# Patient Record
Sex: Female | Born: 1970 | Race: Black or African American | Hispanic: No | Marital: Married | Smoking: Never smoker
Health system: Southern US, Community
[De-identification: ages and names within clinical notes are randomized; demographics above are authoritative.]

## PROBLEM LIST (undated history)

## (undated) DIAGNOSIS — D649 Anemia, unspecified: Secondary | ICD-10-CM

## (undated) DIAGNOSIS — Z5189 Encounter for other specified aftercare: Secondary | ICD-10-CM

## (undated) DIAGNOSIS — R0602 Shortness of breath: Secondary | ICD-10-CM

## (undated) DIAGNOSIS — J40 Bronchitis, not specified as acute or chronic: Secondary | ICD-10-CM

## (undated) DIAGNOSIS — R1900 Intra-abdominal and pelvic swelling, mass and lump, unspecified site: Secondary | ICD-10-CM

## (undated) DIAGNOSIS — Z973 Presence of spectacles and contact lenses: Secondary | ICD-10-CM

## (undated) HISTORY — PX: TUBAL LIGATION: SHX77

---

## 1999-07-09 ENCOUNTER — Other Ambulatory Visit: Admission: RE | Admit: 1999-07-09 | Discharge: 1999-07-09 | Payer: Self-pay | Admitting: Obstetrics & Gynecology

## 1999-07-26 ENCOUNTER — Ambulatory Visit (HOSPITAL_COMMUNITY): Admission: RE | Admit: 1999-07-26 | Discharge: 1999-07-26 | Payer: Self-pay | Admitting: Obstetrics & Gynecology

## 2003-09-23 ENCOUNTER — Emergency Department (HOSPITAL_COMMUNITY): Admission: EM | Admit: 2003-09-23 | Discharge: 2003-09-24 | Payer: Self-pay | Admitting: Emergency Medicine

## 2010-07-21 ENCOUNTER — Ambulatory Visit (INDEPENDENT_AMBULATORY_CARE_PROVIDER_SITE_OTHER): Payer: Self-pay

## 2010-07-21 ENCOUNTER — Inpatient Hospital Stay (INDEPENDENT_AMBULATORY_CARE_PROVIDER_SITE_OTHER)
Admission: RE | Admit: 2010-07-21 | Discharge: 2010-07-21 | Disposition: A | Discharge status: Home / Self Care | Payer: Self-pay | Source: Ambulatory Visit | Attending: Family Medicine | Admitting: Family Medicine

## 2010-07-21 DIAGNOSIS — S139XXA Sprain of joints and ligaments of unspecified parts of neck, initial encounter: Secondary | ICD-10-CM

## 2010-07-21 DIAGNOSIS — S335XXA Sprain of ligaments of lumbar spine, initial encounter: Secondary | ICD-10-CM

## 2011-04-09 DIAGNOSIS — Z862 Personal history of diseases of the blood and blood-forming organs and certain disorders involving the immune mechanism: Secondary | ICD-10-CM

## 2011-04-09 HISTORY — DX: Personal history of diseases of the blood and blood-forming organs and certain disorders involving the immune mechanism: Z86.2

## 2011-05-01 ENCOUNTER — Emergency Department (HOSPITAL_COMMUNITY)
Admission: EM | Admit: 2011-05-01 | Discharge: 2011-05-01 | Disposition: A | Discharge status: Home / Self Care | Payer: 59 | Attending: Emergency Medicine | Admitting: Emergency Medicine

## 2011-05-01 ENCOUNTER — Encounter (HOSPITAL_COMMUNITY): Payer: Self-pay | Admitting: Emergency Medicine

## 2011-05-01 ENCOUNTER — Emergency Department (HOSPITAL_COMMUNITY): Payer: 59

## 2011-05-01 DIAGNOSIS — IMO0001 Reserved for inherently not codable concepts without codable children: Secondary | ICD-10-CM | POA: Insufficient documentation

## 2011-05-01 DIAGNOSIS — R0602 Shortness of breath: Secondary | ICD-10-CM | POA: Insufficient documentation

## 2011-05-01 DIAGNOSIS — R197 Diarrhea, unspecified: Secondary | ICD-10-CM | POA: Insufficient documentation

## 2011-05-01 DIAGNOSIS — R0789 Other chest pain: Secondary | ICD-10-CM | POA: Insufficient documentation

## 2011-05-01 DIAGNOSIS — R1110 Vomiting, unspecified: Secondary | ICD-10-CM | POA: Insufficient documentation

## 2011-05-01 DIAGNOSIS — E860 Dehydration: Secondary | ICD-10-CM | POA: Insufficient documentation

## 2011-05-01 MED ORDER — LOPERAMIDE HCL 2 MG PO CAPS: 2.0000 mg | ORAL_CAPSULE | Freq: Four times a day (QID) | ORAL | Status: AC | PRN

## 2011-05-01 MED ORDER — ALBUTEROL SULFATE (5 MG/ML) 0.5% IN NEBU
5.0000 mg | INHALATION_SOLUTION | Freq: Once | RESPIRATORY_TRACT | Status: AC
  Administered 2011-05-01: 5 mg via RESPIRATORY_TRACT
  Filled 2011-05-01: qty 1

## 2011-05-01 MED ORDER — ALBUTEROL SULFATE HFA 108 (90 BASE) MCG/ACT IN AERS: INHALATION_SPRAY | RESPIRATORY_TRACT | Status: DC

## 2011-05-01 MED ORDER — ONDANSETRON 4 MG PO TBDP: 4.0000 mg | ORAL_TABLET | Freq: Three times a day (TID) | ORAL | Status: AC | PRN

## 2011-05-01 MED ORDER — IPRATROPIUM BROMIDE 0.02 % IN SOLN
0.5000 mg | RESPIRATORY_TRACT | Status: AC
  Administered 2011-05-01: 0.5 mg via RESPIRATORY_TRACT
  Filled 2011-05-01: qty 2.5

## 2011-05-01 MED ORDER — SODIUM CHLORIDE 0.9 % IV BOLUS (SEPSIS)
1000.0000 mL | Freq: Once | INTRAVENOUS | Status: AC
  Administered 2011-05-01: 1000 mL via INTRAVENOUS

## 2011-05-01 MED ORDER — HYDROCODONE-HOMATROPINE 5-1.5 MG/5ML PO SYRP: 5.0000 mL | ORAL_SOLUTION | Freq: Four times a day (QID) | ORAL | Status: AC | PRN

## 2011-05-01 MED ORDER — SODIUM CHLORIDE 0.9 % IV BOLUS (SEPSIS)
500.0000 mL | Freq: Once | INTRAVENOUS | Status: AC
  Administered 2011-05-01: 500 mL via INTRAVENOUS

## 2011-05-01 MED ORDER — ONDANSETRON HCL 4 MG/2ML IJ SOLN
4.0000 mg | Freq: Once | INTRAMUSCULAR | Status: AC
  Administered 2011-05-01: 4 mg via INTRAVENOUS
  Filled 2011-05-01: qty 2

## 2011-05-01 NOTE — ED Notes (Signed)
PT. REPORTS VOMITTING , DIARRHEA , CHILLS , RUNNY NOSE WITH SOB AND PRODUCTIVE COUGH/CHEST CONGESTION ONSET LAST Thursday .

## 2011-05-01 NOTE — ED Notes (Signed)
Pt reports N/V/D since Thursday.  Denies pain.  Alerts and oriented X4. Pt resting and feeling a little better.

## 2011-05-01 NOTE — ED Notes (Signed)
Pt given ice chips

## 2011-05-01 NOTE — ED Provider Notes (Signed)
History     CSN: [id number]  Arrival date & time 05/01/11  0540   First MD Initiated Contact with Patient 05/01/11 [phone]      Chief Complaint  Patient presents with  . Emesis    (Consider location/radiation/quality/duration/timing/severity/associated sxs/prior treatment) Patient is a 41 y.o. female presenting with shortness of breath. The history is provided by the patient.  Shortness of Breath  The current episode started 3 to 5 days ago. The onset was gradual. The problem occurs continuously. The problem has been gradually worsening. The problem is moderate. The symptoms are relieved by nothing. The symptoms are aggravated by nothing. Associated symptoms include rhinorrhea, cough and shortness of breath. Pertinent negatives include no chest pressure, no fever, no sore throat and no wheezing. Her past medical history does not include asthma or past wheezing.   Pt ill with vomiting, diarrhea, myalgias, rhinorrhea which started on Thursday. She began to have a sensation of chest discomfort on Friday which is generalized and has not changed since then. States she feels short of breath which has gotten worse, which is accompanied by a productive cough. No known aggravating/alleviating factors. She does not have hx asthma and is a nonsmoker. Vomiting has decreased since onset but she has had persistent diarrhea. Taking PO well but states she's "unable to keep it down."  Patient has no prior history of DVT/PE. Denies recent trauma, surgery, or prolonged immobilization. Denies hemoptysis or use of exogenous estrogen. Has not noted any leg swelling.  History reviewed. No pertinent past medical history.  Past Surgical History  Procedure Date  . Tubal ligation     No family history on file.  History  Substance Use Topics  . Smoking status: Never Smoker   . Smokeless tobacco: Not on file  . Alcohol Use: No    OB History    Grav Para Term Preterm Abortions TAB SAB Ect Mult Living            Review of Systems  Constitutional: Positive for chills. Negative for fever and appetite change.  HENT: Positive for rhinorrhea. Negative for sore throat and neck pain.   Respiratory: Positive for cough, chest tightness and shortness of breath. Negative for wheezing.   Cardiovascular: Negative for palpitations and leg swelling.  Gastrointestinal: Positive for nausea, vomiting and diarrhea. Negative for abdominal pain and blood in stool.  Genitourinary: Negative for decreased urine volume.  Skin: Negative for rash.  Neurological: Negative for dizziness, weakness and headaches.    Allergies  Review of patient's allergies indicates no known allergies.  Home Medications  No current outpatient prescriptions on file.  BP 119/58  Pulse 83  Temp(Src) 98 F (36.7 C) (Oral)  Resp 19  SpO2 100%  LMP 03/31/2011  Physical Exam  Nursing note and vitals reviewed. Constitutional: She is oriented to person, place, and time. She appears well-developed and well-nourished. No distress.       Uncomfortable appearing  HENT:  Head: Normocephalic and atraumatic.  Eyes: Conjunctivae and EOM are normal. Right eye exhibits no discharge. Left eye exhibits no discharge.  Neck: Normal range of motion. Neck supple.  Cardiovascular: Normal rate, regular rhythm and normal heart sounds.   Pulmonary/Chest: Breath sounds normal. No accessory muscle usage. Tachypnea noted. No respiratory distress. She has no wheezes. She has no rales. She exhibits no tenderness.  Abdominal: Soft. Bowel sounds are normal. There is no tenderness.  Musculoskeletal: She exhibits no edema.  Lymphadenopathy:    She has no cervical adenopathy.  Neurological:  She is alert and oriented to person, place, and time.  Skin: Skin is warm and dry. She is not diaphoretic.  Psychiatric: She has a normal mood and affect.    ED Course  Procedures (including critical care time)  Labs Reviewed - No data to display Dg Chest 2  View  05/01/2011  *RADIOLOGY REPORT*  Clinical Data: Short of breath.  Difficulty breathing.  CHEST - 2 VIEW  Comparison: None.  Findings:  Cardiopericardial silhouette within normal limits. Mediastinal contours normal. Trachea midline.  No airspace disease or effusion.  IMPRESSION: Negative two-view chest.  Original Report Authenticated By: Andreas Newport, M.D.  I personally reviewed the CXR.   1. Influenza-like illness       MDM  6:29 AM Pt evaluated. Noted to be tachypneic, but seems to be moving air well on pulm exam without wheezes. Will send for cxr. Will also give NS as she appears to be dehydrated. Based on H+P, feel that this is infectious in nature. Do not have suspicion for PE or ACS.  9:09 AM CXR is unremarkable for evidence of pneumonia. Pt was given about 1500 cc of fluid here in the dept along with albuterol/atrovent tx. She did well with this and states she is feeling much better. She is no longer tachypneic. Will plan to treat as influenza-like illness and discharge with albuterol inhaler, antiemetics. Discussed plan and return precautions with pt. Encouraged her to push fluids.      [place], [place] 05/01/11 [phone]

## 2011-05-02 NOTE — ED Provider Notes (Signed)
Medical screening examination/treatment/procedure(s) were performed by non-physician practitioner and as supervising physician I was immediately available for consultation/collaboration.   Jenavieve Freda, MD 05/02/11 1735 

## 2011-05-15 ENCOUNTER — Other Ambulatory Visit: Payer: Self-pay | Admitting: Obstetrics and Gynecology

## 2011-05-15 NOTE — H&P (Addendum)
Christina Ford is an 41 y.o. female. G2P2 who presented for a gyn exam 05/15/11 for the first time in 18 years and c/o heavy menstrual cycles for years.  She denied SOB, CP, was ambulating without difficulty and was not orthostatic.  However, her hemoglobin was 4.3, confirmed on a drawn CBC.  Given her profound anemia it was recommended she proceed with transfusion immediately.  Since she was not actively bleeding she preferred to wait until the following day, and was allowed to do so as not very symptomatic.  Pertinent Gynecological History: Menses: flow is excessive with use of 6 pads or tampons on heaviest days Contraception: tubal ligation OB History: G2, P2--NSVD x 2  PMHx None  PSHx BTL  1995      Past Surgical History  Procedure Date  . Tubal ligation     No family history on file.  Social History:  reports that she has never smoked. She does not have any smokeless tobacco history on file. She reports that she does not drink alcohol or use illicit drugs.  Allergies: No Known Allergies   ROS  -SOB, -CP Last menstrual period 03/31/2011. Physical Exam CV  RRR Lungs CTA B Abd  Soft NT Pelvic  Uterus 16 weeks and globular  No active bleeding Extremities normal   Assessment/Plan: Pt with profound anemia for transfusion of 3 units of PRBC's, then may be d/c'd  Majesti Gambrell M. 05/15/2011, 6:01 PM

## 2011-05-16 ENCOUNTER — Encounter (HOSPITAL_COMMUNITY): Payer: Self-pay | Admitting: *Deleted

## 2011-05-16 ENCOUNTER — Observation Stay (HOSPITAL_COMMUNITY)
Admission: AD | Admit: 2011-05-16 | Discharge: 2011-05-16 | Disposition: A | Discharge status: Home / Self Care | Payer: 59 | Source: Ambulatory Visit | Attending: Obstetrics and Gynecology | Admitting: Obstetrics and Gynecology

## 2011-05-16 DIAGNOSIS — D649 Anemia, unspecified: Principal | ICD-10-CM | POA: Insufficient documentation

## 2011-05-16 HISTORY — DX: Anemia, unspecified: D64.9

## 2011-05-16 HISTORY — DX: Bronchitis, not specified as acute or chronic: J40

## 2011-05-16 LAB — CBC
HCT: 23.1 % — ABNORMAL LOW (ref 36.0–46.0)
Hemoglobin: 6.9 g/dL — CL (ref 12.0–15.0)
MCH: 20.7 pg — ABNORMAL LOW (ref 26.0–34.0)
MCHC: 29.9 g/dL — ABNORMAL LOW (ref 30.0–36.0)
MCV: 69.2 fL — ABNORMAL LOW (ref 78.0–100.0)
Platelets: 531 10*3/uL — ABNORMAL HIGH (ref 150–400)
RBC: 3.34 MIL/uL — ABNORMAL LOW (ref 3.87–5.11)
RDW: 31.4 % — ABNORMAL HIGH (ref 11.5–15.5)
WBC: 10.2 10*3/uL (ref 4.0–10.5)

## 2011-05-16 LAB — PREPARE RBC (CROSSMATCH)

## 2011-05-16 MED ORDER — DIPHENHYDRAMINE HCL 25 MG PO CAPS
25.0000 mg | ORAL_CAPSULE | Freq: Once | ORAL | Status: AC
  Administered 2011-05-16: 25 mg via ORAL
  Filled 2011-05-16: qty 1

## 2011-05-16 MED ORDER — SODIUM CHLORIDE 0.9 % IV SOLN
INTRAVENOUS | Status: DC
  Administered 2011-05-16: 900 mL via INTRAVENOUS

## 2011-05-16 MED ORDER — ACETAMINOPHEN 325 MG PO TABS
650.0000 mg | ORAL_TABLET | Freq: Once | ORAL | Status: AC
  Administered 2011-05-16: 650 mg via ORAL
  Filled 2011-05-16: qty 2

## 2011-05-16 MED ORDER — LACTATED RINGERS IV SOLN: INTRAVENOUS | Status: DC

## 2011-05-16 NOTE — Progress Notes (Signed)
UR Chart review completed.  

## 2011-05-16 NOTE — Progress Notes (Signed)
2040 - called Dr. Jackelyn Knife with CBC results and he stated pt. Will be discharged home and that he would put the orders in shortly.  Let pt. Know POC, NSL removed and pt. Up getting ready for d/c

## 2011-05-16 NOTE — Progress Notes (Signed)
1930 - Pt. Resting quietly watching TV awaiting possible discharge home. S.O. At bedside, IV saline locked, to have CBC drawn at 2000 and then Dr. Jackelyn Knife will decide on D/C. Pt. Denies any pain or dicomfort, watching TV awaiting lab draw

## 2011-05-17 LAB — TYPE AND SCREEN
ABO/RH(D): B NEG
Unit division: 0
Unit division: 0

## 2011-06-28 ENCOUNTER — Encounter (HOSPITAL_COMMUNITY): Payer: Self-pay | Admitting: Pharmacist

## 2011-07-12 ENCOUNTER — Encounter (HOSPITAL_COMMUNITY)
Admission: RE | Admit: 2011-07-12 | Discharge: 2011-07-12 | Disposition: A | Discharge status: Home / Self Care | Payer: 59 | Source: Ambulatory Visit | Attending: Obstetrics and Gynecology | Admitting: Obstetrics and Gynecology

## 2011-07-12 ENCOUNTER — Encounter (HOSPITAL_COMMUNITY): Payer: Self-pay

## 2011-07-12 HISTORY — DX: Encounter for other specified aftercare: Z51.89

## 2011-07-12 HISTORY — DX: Shortness of breath: R06.02

## 2011-07-12 LAB — CBC
HCT: 36.7 % (ref 36.0–46.0)
Hemoglobin: 11.1 g/dL — ABNORMAL LOW (ref 12.0–15.0)
RBC: 4.9 MIL/uL (ref 3.87–5.11)

## 2011-07-12 LAB — SURGICAL PCR SCREEN
MRSA, PCR: NEGATIVE
Staphylococcus aureus: NEGATIVE

## 2011-07-12 NOTE — Patient Instructions (Signed)
YOUR PROCEDURE IS SCHEDULED ON:07/17/11  ENTER THROUGH THE MAIN ENTRANCE OF [facility] AT:0700am  USE DESK PHONE AND DIAL [id number] TO INFORM us OF YOUR ARRIVAL  CALL [phone] IF YOU HAVE ANY QUESTIONS OR PROBLEMS PRIOR TO YOUR ARRIVAL.  REMEMBER: DO NOT EAT OR DRINK AFTER MIDNIGHT :Tuesday  SPECIAL INSTRUCTIONS:none   YOU MAY BRUSH YOUR TEETH THE MORNING OF SURGERY   TAKE THESE MEDICINES THE DAY OF SURGERY WITH SIP OF WATER:none   DO NOT WEAR JEWELRY, EYE MAKEUP, LIPSTICK OR DARK FINGERNAIL POLISH DO NOT WEAR LOTIONS  DO NOT SHAVE FOR 48 HOURS PRIOR TO SURGERY  YOU WILL NOT BE ALLOWED TO DRIVE YOURSELF HOME.  NAME OF DRIVER: Delila Pereyra

## 2011-07-15 NOTE — H&P (Signed)
Christina Ford is an 41 y.o. female G2P2 for a scheduled hysterectomy to be attempted as [facility], with TAH if not successful.  Pt has a h/o fibroid uterus causing dysmenorrhea, dyspareunia, and most notably menorrhagia leading to profound anemia with a hemoglobin down to 4.  She was admitted and received a blood transfusion at that time.  Since then she has been on aygestin to limit her bleeding.  An EMB was negative 3/13.   Pertinent Gynecological History: H/o normal paps--no issues Fibroids Past Ob Hx NSVD x 2     Past Medical History  Diagnosis Date  . Anemia   . Bronchitis   . Shortness of breath     poss due to anemia  . Blood transfusion 2013    [facility]-     Past Surgical History  Procedure Date  . Tubal ligation     No family history on file.  Social History:  reports that she has never smoked. She does not have any smokeless tobacco history on file. She reports that she does not drink alcohol or use illicit drugs.  Allergies: No Known Allergies  No prescriptions prior to admission    ROS  There were no vitals taken for this visit. Physical Exam  Constitutional: She is oriented to person, place, and time. She appears well-developed and well-nourished.  Cardiovascular: Normal rate and regular rhythm.   Respiratory: Effort normal and breath sounds normal.  GI: Soft. Bowel sounds are normal.  Genitourinary: Vagina normal. Vaginal discharge: Uterus enlarged and nodular, 12 -14 weeks size.       Uterus 12-14 weeks size and nodular  Neurological: She is alert and oriented to person, place, and time.  Psychiatric: She has a normal mood and affect. Her behavior is normal.    Assessment/Plan: Pt has been counseled extensively on hysterectomy options and is fine with either laparoscopic or abdominal approach.  We discussed risks and benefits including bleeding infection and possible damage to bowel and bladder.  She understands and desires to proceed.  She will perform a  bowel prep prior to surgery.  Oliver Pila 07/15/2011, 11:25 PM

## 2011-07-16 MED ORDER — CEFAZOLIN SODIUM-DEXTROSE 2-3 GM-% IV SOLR
2.0000 g | INTRAVENOUS | Status: DC
  Filled 2011-07-16: qty 50

## 2011-07-17 ENCOUNTER — Encounter (HOSPITAL_COMMUNITY): Payer: Self-pay | Admitting: *Deleted

## 2011-07-17 ENCOUNTER — Ambulatory Visit (HOSPITAL_COMMUNITY)
Admission: RE | Admit: 2011-07-17 | Discharge: 2011-07-18 | Disposition: A | Discharge status: Home / Self Care | Payer: 59 | Source: Ambulatory Visit | Attending: Obstetrics and Gynecology | Admitting: Obstetrics and Gynecology

## 2011-07-17 ENCOUNTER — Encounter (HOSPITAL_COMMUNITY): Payer: Self-pay | Admitting: Registered Nurse

## 2011-07-17 ENCOUNTER — Inpatient Hospital Stay (HOSPITAL_COMMUNITY): Payer: 59 | Admitting: Registered Nurse

## 2011-07-17 ENCOUNTER — Encounter (HOSPITAL_COMMUNITY)
Admission: RE | Disposition: A | Discharge status: Home / Self Care | Payer: Self-pay | Source: Ambulatory Visit | Attending: Obstetrics and Gynecology

## 2011-07-17 DIAGNOSIS — N946 Dysmenorrhea, unspecified: Secondary | ICD-10-CM | POA: Insufficient documentation

## 2011-07-17 DIAGNOSIS — D649 Anemia, unspecified: Secondary | ICD-10-CM | POA: Insufficient documentation

## 2011-07-17 DIAGNOSIS — Z01812 Encounter for preprocedural laboratory examination: Secondary | ICD-10-CM | POA: Insufficient documentation

## 2011-07-17 DIAGNOSIS — Z90710 Acquired absence of both cervix and uterus: Secondary | ICD-10-CM

## 2011-07-17 DIAGNOSIS — Z01818 Encounter for other preprocedural examination: Secondary | ICD-10-CM | POA: Insufficient documentation

## 2011-07-17 DIAGNOSIS — N920 Excessive and frequent menstruation with regular cycle: Secondary | ICD-10-CM | POA: Insufficient documentation

## 2011-07-17 DIAGNOSIS — IMO0002 Reserved for concepts with insufficient information to code with codable children: Secondary | ICD-10-CM | POA: Insufficient documentation

## 2011-07-17 HISTORY — PX: LAPAROSCOPIC SUPRACERVICAL HYSTERECTOMY: SHX5399

## 2011-07-17 LAB — TYPE AND SCREEN
ABO/RH(D): B NEG
Antibody Screen: NEGATIVE

## 2011-07-17 SURGERY — HYSTERECTOMY, SUPRACERVICAL, LAPAROSCOPIC
Anesthesia: General | Wound class: Clean Contaminated

## 2011-07-17 MED ORDER — LIDOCAINE HCL (CARDIAC) 20 MG/ML IV SOLN
INTRAVENOUS | Status: AC
  Filled 2011-07-17: qty 5

## 2011-07-17 MED ORDER — SODIUM CHLORIDE 0.9 % IJ SOLN
INTRAMUSCULAR | Status: DC | PRN
  Administered 2011-07-17: 10 mL via INTRAVENOUS

## 2011-07-17 MED ORDER — FENTANYL CITRATE 0.05 MG/ML IJ SOLN
INTRAMUSCULAR | Status: DC | PRN
  Administered 2011-07-17 (×2): 50 ug via INTRAVENOUS
  Administered 2011-07-17: 150 ug via INTRAVENOUS
  Administered 2011-07-17 (×5): 50 ug via INTRAVENOUS

## 2011-07-17 MED ORDER — KETOROLAC TROMETHAMINE 30 MG/ML IJ SOLN: 30.0000 mg | Freq: Four times a day (QID) | INTRAMUSCULAR | Status: DC

## 2011-07-17 MED ORDER — LACTATED RINGERS IV SOLN
INTRAVENOUS | Status: DC
  Administered 2011-07-17 – 2011-07-18 (×2): via INTRAVENOUS

## 2011-07-17 MED ORDER — GLYCOPYRROLATE 0.2 MG/ML IJ SOLN
INTRAMUSCULAR | Status: DC | PRN
  Administered 2011-07-17: 0.2 mg via INTRAVENOUS

## 2011-07-17 MED ORDER — LIDOCAINE HCL (CARDIAC) 20 MG/ML IV SOLN
INTRAVENOUS | Status: DC | PRN
  Administered 2011-07-17: 60 mg via INTRAVENOUS

## 2011-07-17 MED ORDER — ONDANSETRON HCL 4 MG/2ML IJ SOLN
INTRAMUSCULAR | Status: DC | PRN
  Administered 2011-07-17: 4 mg via INTRAVENOUS

## 2011-07-17 MED ORDER — MIDAZOLAM HCL 2 MG/2ML IJ SOLN
INTRAMUSCULAR | Status: AC
  Filled 2011-07-17: qty 2

## 2011-07-17 MED ORDER — BUPIVACAINE HCL (PF) 0.25 % IJ SOLN
INTRAMUSCULAR | Status: AC
  Filled 2011-07-17: qty 30

## 2011-07-17 MED ORDER — FENTANYL CITRATE 0.05 MG/ML IJ SOLN: 25.0000 ug | INTRAMUSCULAR | Status: DC | PRN

## 2011-07-17 MED ORDER — NEOSTIGMINE METHYLSULFATE 1 MG/ML IJ SOLN
INTRAMUSCULAR | Status: DC | PRN
  Administered 2011-07-17: 2 mg via INTRAVENOUS

## 2011-07-17 MED ORDER — KETOROLAC TROMETHAMINE 30 MG/ML IJ SOLN
INTRAMUSCULAR | Status: AC
  Filled 2011-07-17: qty 1

## 2011-07-17 MED ORDER — HYDROMORPHONE HCL PF 1 MG/ML IJ SOLN
INTRAMUSCULAR | Status: AC
  Filled 2011-07-17: qty 1

## 2011-07-17 MED ORDER — PROPOFOL 10 MG/ML IV EMUL
INTRAVENOUS | Status: AC
  Filled 2011-07-17: qty 20

## 2011-07-17 MED ORDER — SIMETHICONE 80 MG PO CHEW: 80.0000 mg | CHEWABLE_TABLET | Freq: Four times a day (QID) | ORAL | Status: DC | PRN

## 2011-07-17 MED ORDER — OXYCODONE-ACETAMINOPHEN 5-325 MG PO TABS
1.0000 | ORAL_TABLET | ORAL | Status: DC | PRN
  Administered 2011-07-18: 1 via ORAL
  Filled 2011-07-17: qty 1

## 2011-07-17 MED ORDER — ROCURONIUM BROMIDE 100 MG/10ML IV SOLN
INTRAVENOUS | Status: DC | PRN
  Administered 2011-07-17: 5 mg via INTRAVENOUS
  Administered 2011-07-17: 40 mg via INTRAVENOUS

## 2011-07-17 MED ORDER — HYDROMORPHONE HCL PF 1 MG/ML IJ SOLN
INTRAMUSCULAR | Status: DC | PRN
  Administered 2011-07-17: 1 mg via INTRAVENOUS

## 2011-07-17 MED ORDER — FENTANYL CITRATE 0.05 MG/ML IJ SOLN
INTRAMUSCULAR | Status: AC
  Filled 2011-07-17: qty 5

## 2011-07-17 MED ORDER — KETOROLAC TROMETHAMINE 30 MG/ML IJ SOLN
30.0000 mg | Freq: Four times a day (QID) | INTRAMUSCULAR | Status: DC
  Administered 2011-07-17 – 2011-07-18 (×3): 30 mg via INTRAVENOUS
  Filled 2011-07-17 (×3): qty 1

## 2011-07-17 MED ORDER — KETOROLAC TROMETHAMINE 30 MG/ML IJ SOLN
INTRAMUSCULAR | Status: DC | PRN
  Administered 2011-07-17: 30 mg via INTRAVENOUS

## 2011-07-17 MED ORDER — MEPERIDINE HCL 25 MG/ML IJ SOLN: 6.2500 mg | INTRAMUSCULAR | Status: DC | PRN

## 2011-07-17 MED ORDER — BUPIVACAINE HCL (PF) 0.25 % IJ SOLN
INTRAMUSCULAR | Status: DC | PRN
  Administered 2011-07-17: 30 mL

## 2011-07-17 MED ORDER — METOCLOPRAMIDE HCL 5 MG/ML IJ SOLN: 10.0000 mg | Freq: Once | INTRAMUSCULAR | Status: DC | PRN

## 2011-07-17 MED ORDER — LACTATED RINGERS IV SOLN
INTRAVENOUS | Status: DC
  Administered 2011-07-17 (×3): via INTRAVENOUS

## 2011-07-17 MED ORDER — DEXAMETHASONE SODIUM PHOSPHATE 10 MG/ML IJ SOLN
INTRAMUSCULAR | Status: AC
  Filled 2011-07-17: qty 1

## 2011-07-17 MED ORDER — NEOSTIGMINE METHYLSULFATE 1 MG/ML IJ SOLN
INTRAMUSCULAR | Status: AC
  Filled 2011-07-17: qty 10

## 2011-07-17 MED ORDER — LACTATED RINGERS IR SOLN
Status: DC | PRN
  Administered 2011-07-17: 3000 mL

## 2011-07-17 MED ORDER — HYDROMORPHONE HCL PF 1 MG/ML IJ SOLN
0.2000 mg | INTRAMUSCULAR | Status: DC | PRN
  Administered 2011-07-17: 0.6 mg via INTRAVENOUS
  Filled 2011-07-17: qty 1

## 2011-07-17 MED ORDER — DOCUSATE SODIUM 100 MG PO CAPS
100.0000 mg | ORAL_CAPSULE | Freq: Two times a day (BID) | ORAL | Status: DC
  Administered 2011-07-17 – 2011-07-18 (×3): 100 mg via ORAL
  Filled 2011-07-17 (×2): qty 1

## 2011-07-17 MED ORDER — CEFAZOLIN SODIUM 1-5 GM-% IV SOLN
INTRAVENOUS | Status: DC | PRN
  Administered 2011-07-17: 2 g via INTRAVENOUS

## 2011-07-17 MED ORDER — MIDAZOLAM HCL 5 MG/5ML IJ SOLN
INTRAMUSCULAR | Status: DC | PRN
  Administered 2011-07-17: 2 mg via INTRAVENOUS

## 2011-07-17 MED ORDER — PROPOFOL 10 MG/ML IV EMUL
INTRAVENOUS | Status: DC | PRN
  Administered 2011-07-17: 170 mg via INTRAVENOUS

## 2011-07-17 MED ORDER — ONDANSETRON HCL 4 MG/2ML IJ SOLN: 4.0000 mg | Freq: Four times a day (QID) | INTRAMUSCULAR | Status: DC | PRN

## 2011-07-17 MED ORDER — GLYCOPYRROLATE 0.2 MG/ML IJ SOLN
INTRAMUSCULAR | Status: AC
  Filled 2011-07-17: qty 1

## 2011-07-17 MED ORDER — ONDANSETRON HCL 4 MG/2ML IJ SOLN
INTRAMUSCULAR | Status: AC
  Filled 2011-07-17: qty 2

## 2011-07-17 MED ORDER — ONDANSETRON HCL 4 MG PO TABS: 4.0000 mg | ORAL_TABLET | Freq: Four times a day (QID) | ORAL | Status: DC | PRN

## 2011-07-17 MED ORDER — DEXAMETHASONE SODIUM PHOSPHATE 10 MG/ML IJ SOLN
INTRAMUSCULAR | Status: DC | PRN
  Administered 2011-07-17: 10 mg via INTRAVENOUS

## 2011-07-17 MED ORDER — IBUPROFEN 600 MG PO TABS: 600.0000 mg | ORAL_TABLET | Freq: Four times a day (QID) | ORAL | Status: DC | PRN

## 2011-07-17 MED ORDER — ROCURONIUM BROMIDE 50 MG/5ML IV SOLN
INTRAVENOUS | Status: AC
  Filled 2011-07-17: qty 1

## 2011-07-17 SURGICAL SUPPLY — 55 items
ADH SKN CLS APL DERMABOND .7 (GAUZE/BANDAGES/DRESSINGS) ×1
BARRIER ADHS 3X4 INTERCEED (GAUZE/BANDAGES/DRESSINGS) ×1 IMPLANT
BLADE LAPAROSCOPIC MORCELL KIT (BLADE) ×2 IMPLANT
BRR ADH 4X3 ABS CNTRL BYND (GAUZE/BANDAGES/DRESSINGS) ×1
CABLE HIGH FREQUENCY MONO STRZ (ELECTRODE) IMPLANT
CANISTER SUCTION 2500CC (MISCELLANEOUS) ×2 IMPLANT
CHLORAPREP W/TINT 26ML (MISCELLANEOUS) ×2 IMPLANT
CLOTH BEACON ORANGE TIMEOUT ST (SAFETY) ×2 IMPLANT
CONT PATH 16OZ SNAP LID 3702 (MISCELLANEOUS) ×2 IMPLANT
COVER MAYO STAND STRL (DRAPES) ×2 IMPLANT
COVER TABLE BACK 60X90 (DRAPES) ×1 IMPLANT
DECANTER SPIKE VIAL GLASS SM (MISCELLANEOUS) IMPLANT
DERMABOND ADVANCED (GAUZE/BANDAGES/DRESSINGS) ×1
DERMABOND ADVANCED .7 DNX12 (GAUZE/BANDAGES/DRESSINGS) ×1 IMPLANT
EVACUATOR SMOKE 8.L (FILTER) ×4 IMPLANT
GLOVE BIO SURGEON STRL SZ 6.5 (GLOVE) ×4 IMPLANT
GLOVE BIO SURGEON STRL SZ8 (GLOVE) ×2 IMPLANT
GLOVE ORTHO TXT STRL SZ7.5 (GLOVE) ×2 IMPLANT
GOWN PREVENTION PLUS LG XLONG (DISPOSABLE) ×4 IMPLANT
GOWN PREVENTION PLUS XLARGE (GOWN DISPOSABLE) ×2 IMPLANT
NDL HYPO 25X1 1.5 SAFETY (NEEDLE) IMPLANT
NDL INSUFFLATION 14GA 120MM (NEEDLE) ×1 IMPLANT
NEEDLE HYPO 25X1 1.5 SAFETY (NEEDLE) IMPLANT
NEEDLE INSUFFLATION 14GA 120MM (NEEDLE) ×2 IMPLANT
NS IRRIG 1000ML POUR BTL (IV SOLUTION) ×2 IMPLANT
PACK ABDOMINAL GYN (CUSTOM PROCEDURE TRAY) ×1 IMPLANT
PACK LAPAROSCOPY BASIN (CUSTOM PROCEDURE TRAY) ×2 IMPLANT
PACK LAVH (CUSTOM PROCEDURE TRAY) ×1 IMPLANT
PAD OB MATERNITY 4.3X12.25 (PERSONAL CARE ITEMS) ×2 IMPLANT
PROTECTOR NERVE ULNAR (MISCELLANEOUS) ×2 IMPLANT
RETRACTOR WND ALEXIS 25 LRG (MISCELLANEOUS) ×1 IMPLANT
RTRCTR WOUND ALEXIS 25CM LRG (MISCELLANEOUS) ×2
SCALPEL HARMONIC ACE (MISCELLANEOUS) ×1 IMPLANT
SET IRRIG TUBING LAPAROSCOPIC (IRRIGATION / IRRIGATOR) ×2 IMPLANT
SLEEVE ADV FIXATION 5X100MM (TROCAR) ×2 IMPLANT
SPONGE LAP 18X18 X RAY DECT (DISPOSABLE) ×4 IMPLANT
STAPLER VISISTAT 35W (STAPLE) ×2 IMPLANT
SUT CHROMIC 0 CTX 36 (SUTURE) ×2 IMPLANT
SUT PDS AB 0 CTX 60 (SUTURE) IMPLANT
SUT PLAIN 2 0 XLH (SUTURE) IMPLANT
SUT VIC AB 0 CT1 36 (SUTURE) ×4 IMPLANT
SUT VIC AB 3-0 PS2 18 (SUTURE) ×2
SUT VIC AB 3-0 PS2 18XBRD (SUTURE) ×1 IMPLANT
SUT VIC AB 3-0 SH 27 (SUTURE)
SUT VIC AB 3-0 SH 27X BRD (SUTURE) IMPLANT
SUT VICRYL #0 CTB 1 (SUTURE) ×6 IMPLANT
SUT VICRYL 0 TIES 12 18 (SUTURE) ×2 IMPLANT
SUT VICRYL 0 UR6 27IN ABS (SUTURE) ×2 IMPLANT
SYR CONTROL 10ML LL (SYRINGE) IMPLANT
TOWEL OR 17X24 6PK STRL BLUE (TOWEL DISPOSABLE) ×4 IMPLANT
TRAY FOLEY CATH 14FR (SET/KITS/TRAYS/PACK) ×2 IMPLANT
TROCAR Z-THREAD FIOS 12X100MM (TROCAR) ×2 IMPLANT
TROCAR Z-THREAD FIOS 5X100MM (TROCAR) ×3 IMPLANT
WARMER LAPAROSCOPE (MISCELLANEOUS) ×2 IMPLANT
WATER STERILE IRR 1000ML POUR (IV SOLUTION) ×2 IMPLANT

## 2011-07-17 NOTE — Anesthesia Postprocedure Evaluation (Signed)
  Anesthesia Post Note  Patient: Christina Ford  Procedure(s) Performed: Procedure(s) (LRB): LAPAROSCOPIC SUPRACERVICAL HYSTERECTOMY (N/A)  Anesthesia type: General  Patient location: [facility]  Post pain: Pain level controlled  Post assessment: Post-op Vital signs reviewed  Last Vitals:  Filed Vitals:   07/17/11 1330  BP: 112/69  Pulse: 76  Temp: 36.3 C  Resp: 16    Post vital signs: Reviewed and stable  Level of consciousness: awake  Complications: No apparent anesthesia complications

## 2011-07-17 NOTE — Progress Notes (Signed)
Patient ID: Christina Ford, female   DOB: [birth year], 41 y.o.   MRN: [id number] Pt feels fine VS are normal and output is good.

## 2011-07-17 NOTE — Progress Notes (Signed)
Patient ID: Christina Ford, female   DOB: September 17, 1970, 41 y.o.   MRN: [id number] Pt recounseled on risks and benefits of procedure and desires to proceed.  She has no changes in her history and a brief exam is WNL.

## 2011-07-17 NOTE — Transfer of Care (Signed)
Immediate Anesthesia Transfer of Care Note  Patient: Christina Ford  Procedure(s) Performed: Procedure(s) (LRB): LAPAROSCOPIC SUPRACERVICAL HYSTERECTOMY (N/A)  Patient Location: PACU  Anesthesia Type: General  Level of Consciousness: awake, alert  and patient cooperative  Airway & Oxygen Therapy: Patient Spontanous Breathing and Patient connected to nasal cannula oxygen  Post-op Assessment: Report given to PACU RN  Post vital signs: Reviewed  Complications: No apparent anesthesia complications

## 2011-07-17 NOTE — Brief Op Note (Signed)
07/17/2011  11:25 AM  PATIENT:  Gabbi Whetstone  41 y.o. 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

## 2011-07-17 NOTE — Addendum Note (Signed)
Addendum  created 07/17/11 1741 by Algis Greenhouse, CRNA   Modules edited:Notes Section

## 2011-07-17 NOTE — Op Note (Signed)
Operative report  Preoperative diagnosis Fibroid uterus Menorrhagia causing profound anemia Dysmenorrhea and dyspareunia  Postoperative diagnosis Same  Procedure Laparoscopic-assisted supracervical hysterectomy  Surgeon Dr. Huel Cote Assistant Dr. Lavina Hamman  Anesthesia Gen.  Fluids Estimated blood loss 500 cc Urine output 150 cc clear urine IV fluids 2500 cc LR  Findings the uterus was quite bulky with a large posterior fibroids and was consumed with a smaller fibroids. The largest fibroid was noted to the posterior left fundus and was probably 6 cm in diameter. The ovaries and tubes appeared normal as did the remainder of the abdomen and pelvis. Intraoperative weight all morcellated uterus was over 800 g.  Specimen Morcellated uterus was sent to pathology  Procedure note Patient was taken to the operating room after informed consent was obtained and general anesthesia was obtained without difficulty. She was then prepped and draped in the normal sterile fashion in the dorsal lithotomy position. After an appropriate time out was performed and attention was turned to the patient's abdomen where an infraumbilical incision was made with the scalpel after injection with quarter percent Marcaine. The varies needle was then inserted into the incision and into the peritoneal cavity with this placement confirmed by aspiration and injection with normal saline. The varies needle was then removed and a 5 mm Optiview trocar entered into the abdominal cavity under direct visualization. Pneumoperitoneum was obtained with approximately 2-3 L CO2 gas. Patient was placed in Trendelenburg and the abdomen and pelvis inspected with findings as previously stated. 2 additional trochars were then placed 5 mm in the upper right quadrant and a 12 mm in the upper left quadrant. Both of these are injected with quarter percent Marcaine prior to placement. The uterus was then grasped with a tenaculum  and retracted to the left side the harmonic scalpel was utilized to take down the utero-ovarian ligament broad ligament and round ligament. The bladder flap was then developed to the midline. Due to the bulky nature of the uterus it was a slightly difficult to see and the uterine arteries were skeletonized and taken down with the harmonic scalpel. There was some bleeding on this side due to the difficult visualization however it was controlled with harmonic scalpel. The uterus had a fair amount of back bleeding as well do to its size. Attention was then turned to the patient's left where in a similar fashion the utero-ovarian ligament round ligament and round ligament were taken down with harmonic scalpel. The large posterior left fibroid was then grasped and elevated to gain visualization of the uterine arteries. These arteries were then taken down with the harmonic scalpel and cauterized until good hemostasis was obtained. A plane was then developed between the posterior fibroid and the cervix just above the uterosacral ligaments. The harmonic scalpel was then utilized to begin to develop the supracervical plane and this was performed until there was no adequate visualization. Attention was then returned to the right side where that plain was developed and taken all the way across the supracervical area amputating the fundus from the cervix. No active bleeding was noted. The 12 mm trocar was then removed and replaced with the [facility] morcellator. With this in place the uterus was morcellated in its entirety and removed from the abdomen. A weight in operating room confirmed the uterus to be over 800 g. With the uterus completely morcellated and all fragments removed with the spoon. The pelvis was copiously irrigated and a small area of bleeding noted on the cervix at the  anterior right side. This was successfully cauterized with harmonic scalpel. With this completed there is no other areas of bleeding noted. An  Interceed was placed over the cervical stump. Ureters were visualized bilaterally and appeared well away from the surgical field. They were normal in size and peristalsing. The 12 mm trocar was removed and the fascia closed under direct visualization with a 0 Vicryl with careful attention to it note the underlying tissue. With this closed the other trochars were removed and the pneumoperitoneum reduced. The skin at all trocar sites was then closed with 3-0 Vicryl in a subcuticular stitch as well as Dermabond. All instrument and sponge counts were correct and the patient was taken to the recovery room in good condition.

## 2011-07-17 NOTE — Anesthesia Preprocedure Evaluation (Addendum)
Anesthesia Evaluation  Patient identified by MRN, date of birth, ID band Patient awake    Reviewed: Allergy & Precautions, H&P , NPO status , Patient's Chart, lab work & pertinent test results  Airway Mallampati: II TM Distance: >3 FB Neck ROM: full    Dental No notable dental hx. (+) Teeth Intact   Pulmonary    Pulmonary exam normal       Cardiovascular negative cardio ROS  Rhythm:regular Rate:Normal     Neuro/Psych negative neurological ROS  negative psych ROS   GI/Hepatic negative GI ROS, Neg liver ROS,   Endo/Other  negative endocrine ROS  Renal/GU negative Renal ROS  negative genitourinary   Musculoskeletal   Abdominal Normal abdominal exam  (+)   Peds  Hematology negative hematology ROS (+)   Anesthesia Other Findings   Reproductive/Obstetrics negative OB ROS                           Anesthesia Physical Anesthesia Plan  ASA: II  Anesthesia Plan: General ETT   Post-op Pain Management:    Induction:   Airway Management Planned:   Additional Equipment:   Intra-op Plan:   Post-operative Plan:   Informed Consent: I have reviewed the patients History and Physical, chart, labs and discussed the procedure including the risks, benefits and alternatives for the proposed anesthesia with the patient or authorized representative who has indicated his/her understanding and acceptance.   Dental Advisory Given  Plan Discussed with: Anesthesiologist, CRNA and Surgeon  Anesthesia Plan Comments:         Anesthesia Quick Evaluation

## 2011-07-17 NOTE — Anesthesia Postprocedure Evaluation (Signed)
Anesthesia Post Note  Patient: Christina Ford  Procedure(s) Performed: Procedure(s) (LRB): LAPAROSCOPIC SUPRACERVICAL HYSTERECTOMY (N/A)  Anesthesia type: General  Patient location: PACU  Post pain: Pain level controlled  Post assessment: Post-op Vital signs reviewed  Last Vitals:  Filed Vitals:   07/17/11 1236  BP: 118/72  Pulse:   Temp: 36.4 C  Resp: 15    Post vital signs: Reviewed  Level of consciousness: sedated  Complications: No apparent anesthesia complications

## 2011-07-17 NOTE — Preoperative (Signed)
Beta Blockers   Reason not to administer Beta Blockers:Not Applicable 

## 2011-07-18 ENCOUNTER — Encounter (HOSPITAL_COMMUNITY): Payer: Self-pay | Admitting: Obstetrics and Gynecology

## 2011-07-18 LAB — CBC
Hemoglobin: 7.4 g/dL — ABNORMAL LOW (ref 12.0–15.0)
MCH: 22.5 pg — ABNORMAL LOW (ref 26.0–34.0)
MCHC: 30.1 g/dL (ref 30.0–36.0)
RDW: 23.8 % — ABNORMAL HIGH (ref 11.5–15.5)

## 2011-07-18 MED ORDER — OXYCODONE-ACETAMINOPHEN 5-325 MG PO TABS: 1.0000 | ORAL_TABLET | ORAL | Status: AC | PRN

## 2011-07-18 MED ORDER — IBUPROFEN 600 MG PO TABS: 600.0000 mg | ORAL_TABLET | Freq: Four times a day (QID) | ORAL | Status: AC | PRN

## 2011-07-18 NOTE — Discharge Summary (Signed)
Physician Discharge Summary  Patient ID: Christina Ford MRN: [id number] DOB/AGE: 1970/05/19 41 y.o.  Admit date: 07/17/2011 Discharge date: 07/18/2011  Admission Diagnoses: Fibroids                                         Menorrhagia with profound anemia                                         Dysmenorrhea Discharge Diagnoses:  Same and s/p laparoscopic supracervical hysterectomy  Discharged Condition: good  Hospital Course: Pt admitted for postoperative care s/p LSH and did well.  She was anemic at 7.4 but had no symptoms and was ambulating well with good UOP.  She was d/c to home to f/u in 2-3 weeks     Discharge Exam: Blood pressure 123/74, pulse 82, temperature 98.1 F (36.7 C), temperature source Oral, resp. rate 16, height 5\' 9"  (1.753 m), weight 75.206 kg (165 lb 12.8 oz), last menstrual period 07/16/2011, SpO2 99.00%. General appearance: alert and cooperative Cardio: regular rate and rhythm GI: soft, non-tender; bowel sounds normal; no masses,  no organomegaly Incision/Wound:well-approximated  Disposition: 01-Home or Self Care  Discharge Orders    Future Orders Please Complete By Expires   Diet - low sodium heart healthy      Increase activity slowly      Sexual Activity Restrictions      Comments:   Nothing in vagina for 4 weeks   Lifting restrictions      Comments:   No heavy lifting greater than 10 lbs   Discharge instructions      Comments:   May shower over incisions and pat dry     Medication List  As of 07/18/2011  9:07 AM   STOP taking these medications         norethindrone 5 MG tablet         TAKE these medications         ibuprofen 600 MG tablet   Commonly known as: ADVIL,MOTRIN   Take 1 tablet (600 mg total) by mouth every 6 (six) hours as needed (mild pain).      oxyCODONE-acetaminophen 5-325 MG per tablet   Commonly known as: PERCOCET   Take 1-2 tablets by mouth every 3 (three) hours as needed (moderate to severe pain (when tolerating  fluids)).             SignedOliver Pila 07/18/2011, 9:07 AM

## 2011-07-18 NOTE — Progress Notes (Signed)
Pt ambulate  Out    Teaching complete

## 2011-07-18 NOTE — Progress Notes (Signed)
1 Day Post-Op Procedure(s) (LRB): LAPAROSCOPIC SUPRACERVICAL HYSTERECTOMY (N/A)  Subjective: Patient reports tolerating PO and no problems voiding.  No dizziness with ambulation or pain  Objective: I have reviewed patient's vital signs, intake and output and labs.  General: alert Cardio: regular rate and rhythm GI: soft, non-tender; bowel sounds normal; no masses,  no organomegaly Incisions clear and well-approximated  Assessment: s/p Procedure(s) (LRB): LAPAROSCOPIC SUPRACERVICAL HYSTERECTOMY (N/A): progressing well  Plan: Advance diet Discontinue IV fluids Discharge home this pm if doing well.  LOS: 1 day    Devaun Hernandez W 07/18/2011, 9:01 AM

## 2014-02-07 ENCOUNTER — Encounter (HOSPITAL_COMMUNITY): Payer: Self-pay | Admitting: Obstetrics and Gynecology

## 2014-05-11 ENCOUNTER — Other Ambulatory Visit: Payer: Self-pay | Admitting: Obstetrics and Gynecology

## 2014-05-11 DIAGNOSIS — R928 Other abnormal and inconclusive findings on diagnostic imaging of breast: Secondary | ICD-10-CM

## 2014-05-23 ENCOUNTER — Other Ambulatory Visit: Payer: Self-pay

## 2014-05-23 ENCOUNTER — Inpatient Hospital Stay: Admission: RE | Admit: 2014-05-23 | Payer: Self-pay | Source: Ambulatory Visit

## 2014-05-30 ENCOUNTER — Ambulatory Visit
Admission: RE | Admit: 2014-05-30 | Discharge: 2014-05-30 | Disposition: A | Discharge status: Home / Self Care | Payer: 59 | Source: Ambulatory Visit | Attending: Obstetrics and Gynecology | Admitting: Obstetrics and Gynecology

## 2014-05-30 DIAGNOSIS — R928 Other abnormal and inconclusive findings on diagnostic imaging of breast: Secondary | ICD-10-CM

## 2014-10-28 ENCOUNTER — Other Ambulatory Visit: Payer: Self-pay | Admitting: Obstetrics and Gynecology

## 2014-10-28 DIAGNOSIS — N6320 Unspecified lump in the left breast, unspecified quadrant: Secondary | ICD-10-CM

## 2014-12-07 ENCOUNTER — Ambulatory Visit
Admission: RE | Admit: 2014-12-07 | Discharge: 2014-12-07 | Disposition: A | Discharge status: Home / Self Care | Payer: 59 | Source: Ambulatory Visit | Attending: Obstetrics and Gynecology | Admitting: Obstetrics and Gynecology

## 2014-12-07 DIAGNOSIS — N6320 Unspecified lump in the left breast, unspecified quadrant: Secondary | ICD-10-CM

## 2015-05-18 DIAGNOSIS — E8941 Symptomatic postprocedural ovarian failure: Secondary | ICD-10-CM | POA: Diagnosis not present

## 2015-05-18 DIAGNOSIS — Z1389 Encounter for screening for other disorder: Secondary | ICD-10-CM | POA: Diagnosis not present

## 2015-05-18 DIAGNOSIS — Z0000 Encounter for general adult medical examination without abnormal findings: Secondary | ICD-10-CM | POA: Diagnosis not present

## 2015-05-18 DIAGNOSIS — Z01419 Encounter for gynecological examination (general) (routine) without abnormal findings: Secondary | ICD-10-CM | POA: Diagnosis not present

## 2015-05-18 DIAGNOSIS — Z130 Encounter for screening for diseases of the blood and blood-forming organs and certain disorders involving the immune mechanism: Secondary | ICD-10-CM | POA: Diagnosis not present

## 2015-05-18 DIAGNOSIS — Z6823 Body mass index (BMI) 23.0-23.9, adult: Secondary | ICD-10-CM | POA: Diagnosis not present

## 2015-05-18 DIAGNOSIS — Z1231 Encounter for screening mammogram for malignant neoplasm of breast: Secondary | ICD-10-CM | POA: Diagnosis not present

## 2016-06-27 ENCOUNTER — Other Ambulatory Visit: Payer: Self-pay | Admitting: Obstetrics and Gynecology

## 2016-06-27 DIAGNOSIS — N6310 Unspecified lump in the right breast, unspecified quadrant: Secondary | ICD-10-CM

## 2016-07-01 ENCOUNTER — Ambulatory Visit
Admission: RE | Admit: 2016-07-01 | Discharge: 2016-07-01 | Disposition: A | Discharge status: Home / Self Care | Payer: BC Managed Care – PPO | Source: Ambulatory Visit | Attending: Obstetrics and Gynecology | Admitting: Obstetrics and Gynecology

## 2016-07-01 DIAGNOSIS — N6310 Unspecified lump in the right breast, unspecified quadrant: Secondary | ICD-10-CM

## 2017-01-29 ENCOUNTER — Encounter (HOSPITAL_COMMUNITY): Payer: Self-pay | Admitting: Family Medicine

## 2017-01-29 ENCOUNTER — Ambulatory Visit (INDEPENDENT_AMBULATORY_CARE_PROVIDER_SITE_OTHER): Payer: BC Managed Care – PPO

## 2017-01-29 ENCOUNTER — Ambulatory Visit (HOSPITAL_COMMUNITY)
Admission: EM | Admit: 2017-01-29 | Discharge: 2017-01-29 | Disposition: A | Discharge status: Home / Self Care | Payer: BC Managed Care – PPO | Attending: Family Medicine | Admitting: Family Medicine

## 2017-01-29 DIAGNOSIS — M25512 Pain in left shoulder: Secondary | ICD-10-CM

## 2017-01-29 DIAGNOSIS — M25511 Pain in right shoulder: Secondary | ICD-10-CM | POA: Diagnosis not present

## 2017-01-29 MED ORDER — CYCLOBENZAPRINE HCL 10 MG PO TABS: 5.0000 mg | ORAL_TABLET | Freq: Every evening | ORAL | 0 refills | Status: DC | PRN

## 2017-01-29 MED ORDER — NAPROXEN 500 MG PO TABS: 500.0000 mg | ORAL_TABLET | Freq: Two times a day (BID) | ORAL | 0 refills | Status: AC
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
## 2018-04-19 ENCOUNTER — Other Ambulatory Visit: Payer: Self-pay | Admitting: Family

## 2018-06-08 ENCOUNTER — Other Ambulatory Visit: Payer: Self-pay | Admitting: Family
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
## 2019-07-01 ENCOUNTER — Ambulatory Visit: Payer: BC Managed Care – PPO | Attending: Family

## 2019-07-01 DIAGNOSIS — Z23 Encounter for immunization: Secondary | ICD-10-CM

## 2019-07-01 NOTE — Progress Notes (Signed)
   Covid-19 Vaccination Clinic  Name:  Weronika Ertl    MRN: [id number] DOB: 08/01/1970  07/01/2019  Ms. Vannatter was observed post Covid-19 immunization for 15 minutes without incident. She was provided with Vaccine Information Sheet and instruction to access the V-Safe system.   Ms. Postlethwaite was instructed to call 911 with any severe reactions post vaccine: Marland Kitchen Difficulty breathing  . Swelling of face and throat  . A fast heartbeat  . A bad rash all over body  . Dizziness and weakness   Immunizations Administered    Name Date Dose VIS Date Route   Moderna COVID-19 Vaccine 07/01/2019 10:15 AM 0.5 mL 03/09/2019 Intramuscular   Manufacturer: Moderna   Lot: [id number]   [id number]

## 2019-07-29 ENCOUNTER — Ambulatory Visit: Payer: BC Managed Care – PPO | Attending: Family

## 2019-07-29 DIAGNOSIS — Z23 Encounter for immunization: Secondary | ICD-10-CM

## 2019-07-29 NOTE — Progress Notes (Signed)
   Covid-19 Vaccination Clinic  Name:  Shaquana Palmero    MRN: [id number] DOB: 1970/09/21  07/29/2019  Ms. Huegel was observed post Covid-19 immunization for 15 minutes without incident. She was provided with Vaccine Information Sheet and instruction to access the V-Safe system.   Ms. Dyer was instructed to call 911 with any severe reactions post vaccine: Marland Kitchen Difficulty breathing  . Swelling of face and throat  . A fast heartbeat  . A bad rash all over body  . Dizziness and weakness   Immunizations Administered    Name Date Dose VIS Date Route   Moderna COVID-19 Vaccine 07/29/2019  3:51 PM 0.5 mL 03/2019 Intramuscular   Manufacturer: Moderna   Lot: [id number]   [place]:[id number]

## 2019-08-03 ENCOUNTER — Ambulatory Visit: Payer: Self-pay

## 2019-09-23 ENCOUNTER — Other Ambulatory Visit: Payer: Self-pay

## 2019-09-23 ENCOUNTER — Encounter (HOSPITAL_BASED_OUTPATIENT_CLINIC_OR_DEPARTMENT_OTHER): Payer: Self-pay | Admitting: Obstetrics and Gynecology

## 2019-09-23 NOTE — Progress Notes (Signed)
Spoke w/ via phone for pre-op interview--- PT Lab needs dos----  CBC, T&S             Lab results------ no COVID test ------ 09-27-2019@ 0955 Arrive at ------- 0530 NPO after ------ MN Medications to take morning of surgery ----- NONE Diabetic medication ----- n/a Patient Special Instructions ----- n/a Pre-Op special Istructions ----- n/a Patient verbalized understanding of instructions that were given at this phone interview. Patient denies shortness of breath, chest pain, fever, cough a this phone interview.

## 2019-09-28 ENCOUNTER — Other Ambulatory Visit (HOSPITAL_COMMUNITY)
Admission: RE | Admit: 2019-09-28 | Discharge: 2019-09-28 | Disposition: A | Discharge status: Home / Self Care | Payer: BC Managed Care – PPO | Source: Ambulatory Visit | Attending: Obstetrics and Gynecology | Admitting: Obstetrics and Gynecology

## 2019-09-28 DIAGNOSIS — Z20822 Contact with and (suspected) exposure to covid-19: Secondary | ICD-10-CM | POA: Insufficient documentation

## 2019-09-28 DIAGNOSIS — Z01812 Encounter for preprocedural laboratory examination: Secondary | ICD-10-CM | POA: Insufficient documentation

## 2019-09-28 LAB — SARS CORONAVIRUS 2 (TAT 6-24 HRS): SARS Coronavirus 2: NEGATIVE

## 2019-09-29 NOTE — H&P (Signed)
Christina Ford is an 49 y.o. female G2P2 who presents for laparoscopic resection of a persistent  right adnexal cyst, 4+ cm in size and not resolving over last year. She had elected to follow the mass given that it was stable through the pandemic.The mass by [place] was first noted 12/2018 as an avascular complex mass of the right adnexa with no normal ovary seen.  A Ca-125 was WNL and the mass was followed by serial [place] every 2-3 months since then, with no significant change.  She has not having any pain or issues, but now that she is fully vaccinated, she feels ready to proceed with definitive surgical management.  [facility] and has done very well with no issues since then.  Pertinent Gynecological History:  OB History: NSVD x 2   Menstrual History:  Patient's last menstrual period was 07/16/2011.    Past Medical History:  Diagnosis Date  . History of anemia 2013   chronic blood loss due to menorrhagia had blood transfusion's  . Pelvic mass in female   . Wears contact lenses     Past Surgical History:  Procedure Laterality Date  . LAPAROSCOPIC SUPRACERVICAL HYSTERECTOMY  07/17/2011   Procedure: LAPAROSCOPIC SUPRACERVICAL HYSTERECTOMY;  Surgeon: Logan Bores, MD;  Location: [facility];  Service: Gynecology;  Laterality: N/A;  . TUBAL LIGATION  yrs ago    History reviewed. No pertinent family history.  Social History:  reports that she has never smoked. She has never used smokeless tobacco. She reports that she does not drink alcohol and does not use drugs.  Allergies: No Known Allergies  No medications prior to admission.    Review of Systems  Gastrointestinal: Negative for abdominal pain.  Genitourinary: Negative for menstrual problem and pelvic pain.    Height 5\' 9"  (1.753 m), weight 74.8 kg, last menstrual period 07/16/2011. Physical Exam  Cardiovascular: Normal rate and regular rhythm.  Respiratory: Effort normal and breath sounds normal.  GI: Soft.  Normal appearance.  Genitourinary:    Vulva normal.     Genitourinary Comments: Nodular mass felt in right pelvis, cervix WNL   Neurological: She is alert.  Psychiatric: Mood normal.    No results found for this or any previous visit (from the past 24 hour(s)).  No results found.  Assessment/Plan: d/w pt laparoscopy in detail including procedure, and risks of bleeding , infection, and possible damage to bowel or bladder.  We discussed ovarian cystectomy vs oophorectomy depending on how much normal ovarian tissue was visible.  She understands in the event of a complication she would need a larger incision and that this would delay her recovery.  Logan Bores 09/29/2019, 7:36 PM

## 2019-09-29 NOTE — Anesthesia Preprocedure Evaluation (Addendum)
Anesthesia Evaluation  Patient identified by MRN, date of birth, ID band Patient awake    Reviewed: Allergy & Precautions, NPO status , Patient's Chart, lab work & pertinent test results  Airway Mallampati: II  TM Distance: >3 FB Neck ROM: Full    Dental  (+) Teeth Intact, Dental Advisory Given   Pulmonary neg pulmonary ROS,    Pulmonary exam normal breath sounds clear to auscultation       Cardiovascular negative cardio ROS Normal cardiovascular exam Rhythm:Regular Rate:Normal     Neuro/Psych negative neurological ROS     GI/Hepatic negative GI ROS, Neg liver ROS,   Endo/Other  negative endocrine ROS  Renal/GU negative Renal ROS     Musculoskeletal negative musculoskeletal ROS (+)   Abdominal   Peds  Hematology negative hematology ROS (+)   Anesthesia Other Findings   Reproductive/Obstetrics Pelvic mass                            Anesthesia Physical Anesthesia Plan  ASA: I  Anesthesia Plan: General   Post-op Pain Management:    Induction: Intravenous  PONV Risk Score and Plan: 4 or greater and Diphenhydramine, Scopolamine patch - Pre-op, Midazolam, Dexamethasone and Ondansetron  Airway Management Planned: Oral ETT  Additional Equipment:   Intra-op Plan:   Post-operative Plan: Extubation in OR  Informed Consent: I have reviewed the patients History and Physical, chart, labs and discussed the procedure including the risks, benefits and alternatives for the proposed anesthesia with the patient or authorized representative who has indicated his/her understanding and acceptance.     Dental advisory given  Plan Discussed with: CRNA  Anesthesia Plan Comments:        Anesthesia Quick Evaluation

## 2019-09-30 ENCOUNTER — Other Ambulatory Visit: Payer: Self-pay

## 2019-09-30 ENCOUNTER — Encounter (HOSPITAL_BASED_OUTPATIENT_CLINIC_OR_DEPARTMENT_OTHER)
Admission: RE | Disposition: A | Discharge status: Home / Self Care | Payer: Self-pay | Source: Home / Self Care | Attending: Obstetrics and Gynecology

## 2019-09-30 ENCOUNTER — Ambulatory Visit (HOSPITAL_BASED_OUTPATIENT_CLINIC_OR_DEPARTMENT_OTHER)
Admission: RE | Admit: 2019-09-30 | Discharge: 2019-09-30 | Disposition: A | Discharge status: Home / Self Care | Payer: BC Managed Care – PPO | Attending: Obstetrics and Gynecology | Admitting: Obstetrics and Gynecology

## 2019-09-30 ENCOUNTER — Ambulatory Visit (HOSPITAL_BASED_OUTPATIENT_CLINIC_OR_DEPARTMENT_OTHER): Payer: BC Managed Care – PPO | Admitting: Anesthesiology

## 2019-09-30 ENCOUNTER — Encounter (HOSPITAL_BASED_OUTPATIENT_CLINIC_OR_DEPARTMENT_OTHER): Payer: Self-pay | Admitting: Obstetrics and Gynecology

## 2019-09-30 DIAGNOSIS — K660 Peritoneal adhesions (postprocedural) (postinfection): Secondary | ICD-10-CM | POA: Insufficient documentation

## 2019-09-30 DIAGNOSIS — N838 Other noninflammatory disorders of ovary, fallopian tube and broad ligament: Secondary | ICD-10-CM | POA: Insufficient documentation

## 2019-09-30 DIAGNOSIS — D271 Benign neoplasm of left ovary: Secondary | ICD-10-CM | POA: Diagnosis not present

## 2019-09-30 DIAGNOSIS — N839 Noninflammatory disorder of ovary, fallopian tube and broad ligament, unspecified: Secondary | ICD-10-CM | POA: Diagnosis present

## 2019-09-30 HISTORY — PX: LAPAROSCOPIC OVARIAN CYSTECTOMY: SHX6248

## 2019-09-30 HISTORY — DX: Intra-abdominal and pelvic swelling, mass and lump, unspecified site: R19.00

## 2019-09-30 HISTORY — PX: LAPAROSCOPY: SHX197

## 2019-09-30 HISTORY — PX: LAPAROSCOPIC UNILATERAL SALPINGO OOPHERECTOMY: SHX5935

## 2019-09-30 HISTORY — DX: Presence of spectacles and contact lenses: Z97.3

## 2019-09-30 LAB — CBC
HCT: 44.8 % (ref 36.0–46.0)
Hemoglobin: 13.9 g/dL (ref 12.0–15.0)
MCH: 28.7 pg (ref 26.0–34.0)
MCHC: 31 g/dL (ref 30.0–36.0)
MCV: 92.4 fL (ref 80.0–100.0)
Platelets: 249 10*3/uL (ref 150–400)
RBC: 4.85 MIL/uL (ref 3.87–5.11)
RDW: 12 % (ref 11.5–15.5)
WBC: 3.5 10*3/uL — ABNORMAL LOW (ref 4.0–10.5)
nRBC: 0 % (ref 0.0–0.2)

## 2019-09-30 LAB — BPAM RBC
Blood Product Expiration Date: 202107032359
Unit Type and Rh: 1700

## 2019-09-30 LAB — TYPE AND SCREEN
ABO/RH(D): B NEG
Antibody Screen: NEGATIVE
Unit division: 0

## 2019-09-30 LAB — ABO/RH: ABO/RH(D): B NEG

## 2019-09-30 SURGERY — LAPAROSCOPY OPERATIVE
Anesthesia: General | Site: Abdomen | Laterality: Right

## 2019-09-30 MED ORDER — MIDAZOLAM HCL 2 MG/2ML IJ SOLN
INTRAMUSCULAR | Status: DC | PRN
  Administered 2019-09-30: 2 mg via INTRAVENOUS

## 2019-09-30 MED ORDER — HYDROCODONE-ACETAMINOPHEN 5-325 MG PO TABS: 1.0000 | ORAL_TABLET | ORAL | 0 refills | Status: AC | PRN

## 2019-09-30 MED ORDER — PROPOFOL 10 MG/ML IV BOLUS
INTRAVENOUS | Status: AC
  Filled 2019-09-30: qty 20

## 2019-09-30 MED ORDER — DIPHENHYDRAMINE HCL 50 MG/ML IJ SOLN
INTRAMUSCULAR | Status: DC | PRN
  Administered 2019-09-30: 12.5 mg via INTRAVENOUS

## 2019-09-30 MED ORDER — ACETAMINOPHEN 500 MG PO TABS
1000.0000 mg | ORAL_TABLET | Freq: Once | ORAL | Status: AC
  Administered 2019-09-30: 1000 mg via ORAL

## 2019-09-30 MED ORDER — SUGAMMADEX SODIUM 200 MG/2ML IV SOLN
INTRAVENOUS | Status: DC | PRN
  Administered 2019-09-30: 200 mg via INTRAVENOUS

## 2019-09-30 MED ORDER — FENTANYL CITRATE (PF) 250 MCG/5ML IJ SOLN
INTRAMUSCULAR | Status: DC | PRN
  Administered 2019-09-30: 50 ug via INTRAVENOUS
  Administered 2019-09-30: 100 ug via INTRAVENOUS

## 2019-09-30 MED ORDER — DEXAMETHASONE SODIUM PHOSPHATE 10 MG/ML IJ SOLN
INTRAMUSCULAR | Status: DC | PRN
  Administered 2019-09-30: 5 mg via INTRAVENOUS

## 2019-09-30 MED ORDER — ACETAMINOPHEN 500 MG PO TABS
ORAL_TABLET | ORAL | Status: AC
  Filled 2019-09-30: qty 2

## 2019-09-30 MED ORDER — LACTATED RINGERS IV SOLN: INTRAVENOUS | Status: DC

## 2019-09-30 MED ORDER — PROMETHAZINE HCL 25 MG/ML IJ SOLN: 6.2500 mg | INTRAMUSCULAR | Status: DC | PRN

## 2019-09-30 MED ORDER — LIDOCAINE 2% (20 MG/ML) 5 ML SYRINGE
INTRAMUSCULAR | Status: AC
  Filled 2019-09-30: qty 5

## 2019-09-30 MED ORDER — ONDANSETRON HCL 4 MG/2ML IJ SOLN
INTRAMUSCULAR | Status: DC | PRN
  Administered 2019-09-30: 4 mg via INTRAVENOUS

## 2019-09-30 MED ORDER — LIDOCAINE 20MG/ML (2%) 15 ML SYRINGE OPTIME
INTRAMUSCULAR | Status: DC | PRN
  Administered 2019-09-30: 1.5 mg/kg/h via INTRAVENOUS

## 2019-09-30 MED ORDER — IBUPROFEN 200 MG PO TABS: 600.0000 mg | ORAL_TABLET | Freq: Four times a day (QID) | ORAL | 0 refills | Status: AC | PRN

## 2019-09-30 MED ORDER — FENTANYL CITRATE (PF) 100 MCG/2ML IJ SOLN: 25.0000 ug | INTRAMUSCULAR | Status: DC | PRN

## 2019-09-30 MED ORDER — BUPIVACAINE HCL (PF) 0.25 % IJ SOLN
INTRAMUSCULAR | Status: DC | PRN
  Administered 2019-09-30: 19 mL

## 2019-09-30 MED ORDER — ROCURONIUM BROMIDE 10 MG/ML (PF) SYRINGE
PREFILLED_SYRINGE | INTRAVENOUS | Status: AC
  Filled 2019-09-30: qty 10

## 2019-09-30 MED ORDER — SCOPOLAMINE 1 MG/3DAYS TD PT72
1.0000 | MEDICATED_PATCH | Freq: Once | TRANSDERMAL | Status: DC
  Administered 2019-09-30: 1.5 mg via TRANSDERMAL

## 2019-09-30 MED ORDER — FENTANYL CITRATE (PF) 250 MCG/5ML IJ SOLN
INTRAMUSCULAR | Status: AC
  Filled 2019-09-30: qty 5

## 2019-09-30 MED ORDER — SCOPOLAMINE 1 MG/3DAYS TD PT72
MEDICATED_PATCH | TRANSDERMAL | Status: AC
  Filled 2019-09-30: qty 1

## 2019-09-30 MED ORDER — LIDOCAINE 2% (20 MG/ML) 5 ML SYRINGE
INTRAMUSCULAR | Status: AC
  Filled 2019-09-30: qty 10

## 2019-09-30 MED ORDER — MIDAZOLAM HCL 2 MG/2ML IJ SOLN
INTRAMUSCULAR | Status: AC
  Filled 2019-09-30: qty 2

## 2019-09-30 MED ORDER — LIDOCAINE 2% (20 MG/ML) 5 ML SYRINGE
INTRAMUSCULAR | Status: DC | PRN
  Administered 2019-09-30: 60 mg via INTRAVENOUS

## 2019-09-30 MED ORDER — DIPHENHYDRAMINE HCL 50 MG/ML IJ SOLN
INTRAMUSCULAR | Status: AC
  Filled 2019-09-30: qty 2

## 2019-09-30 MED ORDER — PROPOFOL 10 MG/ML IV BOLUS
INTRAVENOUS | Status: DC | PRN
  Administered 2019-09-30: 150 mg via INTRAVENOUS

## 2019-09-30 MED ORDER — ROCURONIUM BROMIDE 10 MG/ML (PF) SYRINGE
PREFILLED_SYRINGE | INTRAVENOUS | Status: DC | PRN
  Administered 2019-09-30: 10 mg via INTRAVENOUS
  Administered 2019-09-30: 60 mg via INTRAVENOUS

## 2019-09-30 SURGICAL SUPPLY — 37 items
ADH SKN CLS APL DERMABOND .7 (GAUZE/BANDAGES/DRESSINGS) ×4
BAG RETRIEVAL 10 (BASKET) ×1
BAG RETRIEVAL 10MM (BASKET) ×1
CABLE HIGH FREQUENCY MONO STRZ (ELECTRODE) ×2 IMPLANT
CATH ROBINSON RED A/P 16FR (CATHETERS) ×6 IMPLANT
CNTNR URN SCR LID CUP LEK RST (MISCELLANEOUS) IMPLANT
CONT SPEC 4OZ STRL OR WHT (MISCELLANEOUS) ×6
COVER SURGICAL LIGHT HANDLE (MISCELLANEOUS) ×2 IMPLANT
COVER WAND RF STERILE (DRAPES) ×6 IMPLANT
DERMABOND ADVANCED (GAUZE/BANDAGES/DRESSINGS) ×2
DERMABOND ADVANCED .7 DNX12 (GAUZE/BANDAGES/DRESSINGS) ×4 IMPLANT
DRSG COVADERM PLUS 2X2 (GAUZE/BANDAGES/DRESSINGS) IMPLANT
DRSG OPSITE POSTOP 3X4 (GAUZE/BANDAGES/DRESSINGS) IMPLANT
DURAPREP 26ML APPLICATOR (WOUND CARE) ×6 IMPLANT
GAUZE 4X4 16PLY RFD (DISPOSABLE) ×6 IMPLANT
GLOVE BIO SURGEON STRL SZ7 (GLOVE) ×12 IMPLANT
GOWN STRL REUS W/TWL LRG LVL3 (GOWN DISPOSABLE) ×6 IMPLANT
NEEDLE INSUFFLATION 120MM (ENDOMECHANICALS) IMPLANT
NS IRRIG 500ML POUR BTL (IV SOLUTION) ×6 IMPLANT
PACK LAPAROSCOPY BASIN (CUSTOM PROCEDURE TRAY) ×6 IMPLANT
PACK TRENDGUARD 450 HYBRID PRO (MISCELLANEOUS) ×4 IMPLANT
PROTECTOR NERVE ULNAR (MISCELLANEOUS) ×12 IMPLANT
SET SUCTION IRRIG HYDROSURG (IRRIGATION / IRRIGATOR) IMPLANT
SET TUBE SMOKE EVAC HIGH FLOW (TUBING) ×6 IMPLANT
SHEARS HARMONIC ACE PLUS 36CM (ENDOMECHANICALS) ×2 IMPLANT
SUT VIC AB 4-0 SH 27 (SUTURE) ×6
SUT VIC AB 4-0 SH 27XANBCTRL (SUTURE) IMPLANT
SUT VICRYL 0 UR6 27IN ABS (SUTURE) IMPLANT
SUT VICRYL 4-0 PS2 18IN ABS (SUTURE) ×6 IMPLANT
SYS BAG RETRIEVAL 10MM (BASKET) ×4
SYSTEM BAG RETRIEVAL 10MM (BASKET) IMPLANT
TOWEL OR 17X26 10 PK STRL BLUE (TOWEL DISPOSABLE) ×6 IMPLANT
TRENDGUARD 450 HYBRID PRO PACK (MISCELLANEOUS) ×6
TROCAR BALLN 12MMX100 BLUNT (TROCAR) ×2 IMPLANT
TROCAR BLADELESS OPT 5 100 (ENDOMECHANICALS) ×4 IMPLANT
TROCAR XCEL NON-BLD 11X100MML (ENDOMECHANICALS) ×6 IMPLANT
WARMER LAPAROSCOPE (MISCELLANEOUS) ×6 IMPLANT

## 2019-09-30 NOTE — Transfer of Care (Signed)
Immediate Anesthesia Transfer of Care Note  Patient: Christina Ford  Procedure(s) Performed: LAPAROSCOPY OPERATIVE, removal of mass (N/A Abdomen) LAPAROSCOPIC OVARIAN CYSTECTOMY (Right Abdomen) LAPAROSCOPIC BILATERAL SALPINGECTOMY (Bilateral Abdomen) LAPAROSCOPIC OOPHORECTOMY (Left Abdomen)  Patient Location: PACU  Anesthesia Type:General  Level of Consciousness: awake, drowsy and responds to stimulation  Airway & Oxygen Therapy: Patient Spontanous Breathing and Patient connected to nasal cannula oxygen  Post-op Assessment: Report given to RN and Post -op Vital signs reviewed and stable  Post vital signs: Reviewed and stable  Last Vitals:  Vitals Value Taken Time  BP 149/91 09/30/19 0907  Temp    Pulse 76 09/30/19 0909  Resp 18 09/30/19 0909  SpO2 100 % 09/30/19 0909  Vitals shown include unvalidated device data.  Last Pain:  Vitals:   09/30/19 0557  TempSrc:   PainSc: 0-No pain         Complications: No complications documented.

## 2019-09-30 NOTE — Anesthesia Postprocedure Evaluation (Signed)
Anesthesia Post Note  Patient: Christina Ford  Procedure(s) Performed: LAPAROSCOPY OPERATIVE, removal of mass (N/A Abdomen) LAPAROSCOPIC OVARIAN CYSTECTOMY (Right Abdomen) LAPAROSCOPIC BILATERAL SALPINGECTOMY (Bilateral Abdomen) LAPAROSCOPIC OOPHORECTOMY (Left Abdomen)     Patient location during evaluation: PACU Anesthesia Type: General Level of consciousness: awake and alert Pain management: pain level controlled Vital Signs Assessment: post-procedure vital signs reviewed and stable Respiratory status: spontaneous breathing, nonlabored ventilation, respiratory function stable and patient connected to nasal cannula oxygen Cardiovascular status: blood pressure returned to baseline and stable Postop Assessment: no apparent nausea or vomiting Anesthetic complications: no   No complications documented.  Last Vitals:  Vitals:   09/30/19 1015 09/30/19 1051  BP: (!) 142/90 (!) 141/89  Pulse: 67 69  Resp: 11 16  Temp:  36.9 C  SpO2: 98% 99%    Last Pain:  Vitals:   09/30/19 1051  TempSrc:   PainSc: 7                  Catalina Gravel

## 2019-09-30 NOTE — Op Note (Signed)
Operative Note  Dr. 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  The cervix had a small 1cm fibroid.  The left ovary and tube were WNL.  The appendix was normal. The right ovary was uniformly enlarged with a solid appearing lesion and no identifiable normal ovarian tissue. 4-5cm in diameter, smooth and mobile. Cul-de-sac and remainder of abdomen were clear.  Specimen Right ovary and tube Left distal tube  Procedure Note Patient was taken to the operating room where general anesthesia was obtained without difficulty she was prepped and draped in the normal sterile fashion in the dorsal lithotomy position. An appropriate time out was performed. A Hulka tenaculum was placed within cervix and a Foley catheter in the bladder. Attention was then turned to the abdomen where of infraumbilical incision was made after injection with quarter percent Marcaine approximate 2 cm in width. The fascia was then identified and grasped with Coker clamps and elevated. The fascia was then opened sharply with Mayo scissors and intraperitoneal placement of the Sheryle Hail was performed, after a pursestring suture of 0 Vicryl was placed around the fascial edge. With the [facility] in place the pneumoperitoneum was obtained and the pelvis and abdomen inspected with findings as previously stated. Two additional 5 mm ports were placed under direct  visualization from the lateral upper quadrants. Each port site was injected with quarter percent Marcaine prior placement.  The pelvis was examined with findings as previously stated.  The omental adhesions were taken down from the cervical stump to gain visualization.  The enlarged right adnexa was mobile and taken down from the right pelvic sidewall where it was slightly adherent with careful attention to hug the ovary.  The infundibulopelvic ligament was taken down and pedicle hemostatic.  The ovary was now fully detached. and placed in the cul-de-sac.  The right distal tube was removed and handed off to pathology. After switching to the 5 scope, the endobag was deployed through the [place] and the left adnexa placed in the bag.  This was then pulled up to the incision and the [facility] removed.  The tissue was solid and firm with no cystic component, so to collapse it enough to remove it through the incision, it was morcellated in the endobag with scissors.  When it was small enough the entire bag was removed intact with the remaining mass and handed off to pathology.  A four quadrant check of the abdomen revealed no bleeding or obvious injuries.  The ureters were normal in appearance and well away from the pedicle on the right. The pursestring of the umbilical port was then closed with no defect noted while directly visualizing with the camera.   The 5 mm ports were then removed after pneumoperitoneum reduced. The umbilical incision was closed with a deep suture of 2-0 vicryl to close the subcutaneous space.  All skin incisions were closed with 4-0 vicryl in a subcuticular stitch and dermabond.  The hulka tenaculum was removed from the  cervix. All instrument and sponge counts were correct and the patient was awakened and taken to recovery in good condition.

## 2019-09-30 NOTE — Discharge Instructions (Signed)

## 2019-09-30 NOTE — Progress Notes (Signed)
Patient ID: Christina Ford, female   DOB: [birth year], 49 y.o.   MRN: [id number] Per pt no changes in dictated H&P, brief exam WNL Ready to proceed

## 2019-09-30 NOTE — Anesthesia Procedure Notes (Signed)
Procedure Name: Intubation Date/Time: 09/30/2019 7:34 AM Performed by: Lollie Sails, CRNA Pre-anesthesia Checklist: Patient identified, Emergency Drugs available, Suction available, Patient being monitored and Timeout performed Patient Re-evaluated:Patient Re-evaluated prior to induction Oxygen Delivery Method: Circle system utilized Preoxygenation: Pre-oxygenation with 100% oxygen Induction Type: IV induction Ventilation: Mask ventilation without difficulty Laryngoscope Size: Miller and 3 Grade View: Grade I Tube type: Oral Tube size: 7.0 mm Number of attempts: 1 Airway Equipment and Method: Stylet Placement Confirmation: ETT inserted through vocal cords under direct vision,  positive ETCO2 and breath sounds checked- equal and bilateral Secured at: 23 cm Tube secured with: Tape Dental Injury: Teeth and Oropharynx as per pre-operative assessment

## 2019-10-01 ENCOUNTER — Encounter (HOSPITAL_BASED_OUTPATIENT_CLINIC_OR_DEPARTMENT_OTHER): Payer: Self-pay | Admitting: Obstetrics and Gynecology

## 2019-10-01 LAB — SURGICAL PATHOLOGY

## 2023-04-11 ENCOUNTER — Other Ambulatory Visit: Payer: Self-pay | Admitting: Internal Medicine

## 2023-04-11 DIAGNOSIS — Z0000 Encounter for general adult medical examination without abnormal findings: Secondary | ICD-10-CM

## 2023-05-09 ENCOUNTER — Ambulatory Visit
Admission: RE | Admit: 2023-05-09 | Discharge: 2023-05-09 | Disposition: A | Discharge status: Home / Self Care | Payer: 59 | Source: Ambulatory Visit | Attending: Internal Medicine

## 2023-05-09 DIAGNOSIS — Z0000 Encounter for general adult medical examination without abnormal findings: Secondary | ICD-10-CM

## 2024-04-13 ENCOUNTER — Other Ambulatory Visit: Payer: Self-pay | Admitting: Internal Medicine

## 2024-04-13 DIAGNOSIS — Z1231 Encounter for screening mammogram for malignant neoplasm of breast: Secondary | ICD-10-CM

## 2024-05-21 ENCOUNTER — Ambulatory Visit
